# Patient Record
Sex: Female | Born: 1995 | Race: Black or African American | Hispanic: No | Marital: Single | Smoking: Never smoker
Health system: Southern US, Community
[De-identification: ages and names within clinical notes are randomized; demographics above are authoritative.]

## PROBLEM LIST (undated history)

## (undated) DIAGNOSIS — F419 Anxiety disorder, unspecified: Secondary | ICD-10-CM

---
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
## 2017-04-22 ENCOUNTER — Encounter (HOSPITAL_COMMUNITY): Payer: Self-pay | Admitting: Emergency Medicine

## 2017-04-22 DIAGNOSIS — F419 Anxiety disorder, unspecified: Secondary | ICD-10-CM | POA: Insufficient documentation

## 2017-04-22 DIAGNOSIS — Z79899 Other long term (current) drug therapy: Secondary | ICD-10-CM | POA: Insufficient documentation

## 2017-04-22 DIAGNOSIS — R51 Headache: Secondary | ICD-10-CM | POA: Insufficient documentation

## 2017-04-22 NOTE — ED Triage Notes (Signed)
Pt states she has been feeling anxious today like she was going to have panic attacks  Pt states she has hx of same but is not on any medication for it  Pt denies any different stressors at this time  Pt is c/o headache

## 2017-04-23 ENCOUNTER — Emergency Department (HOSPITAL_COMMUNITY)
Admission: EM | Admit: 2017-04-23 | Discharge: 2017-04-23 | Disposition: A | Discharge status: Home / Self Care | Payer: No Typology Code available for payment source | Attending: Emergency Medicine | Admitting: Emergency Medicine

## 2017-04-23 DIAGNOSIS — F419 Anxiety disorder, unspecified: Secondary | ICD-10-CM

## 2017-04-23 DIAGNOSIS — R51 Headache: Secondary | ICD-10-CM

## 2017-04-23 DIAGNOSIS — R519 Headache, unspecified: Secondary | ICD-10-CM

## 2017-04-23 HISTORY — DX: Anxiety disorder, unspecified: F41.9

## 2017-04-23 MED ORDER — KETOROLAC TROMETHAMINE 30 MG/ML IJ SOLN
30.0000 mg | Freq: Once | INTRAMUSCULAR | Status: AC
  Administered 2017-04-23: 30 mg via INTRAMUSCULAR
  Filled 2017-04-23: qty 1

## 2017-04-23 NOTE — ED Provider Notes (Signed)
[facility]-EMERGENCY DEPT Provider Note   CSN: [id number] Arrival date & time: 04/22/17  2325     History   Chief Complaint Chief Complaint  Patient presents with  . Anxiety    HPI Maureen Donovan is a 21 y.o. female.  HPI   21 year old female presents today with complaints of anxiety and headache. Patient notes left-sided throbbing headache onset this morning. She notes symptoms persisted despite ibuprofen therapy. She denies any acute neurological deficits, denies any fever, neck stiffness, or any other red flags for headache. Patient denies any known triggers. Patient also reports she had an anxiety attack earlier in the day, likely secondary to headache. Patient reports this anxiety attack similar to previous that she has a history of the same. Patient denies any other concerning signs or symptoms today.   Past Medical History:  Diagnosis Date  . Anxiety     There are no active problems to display for this patient.   History reviewed. No pertinent surgical history.  OB History    No data available       Home Medications    Prior to Admission medications   Medication Sig Start Date End Date Taking? Authorizing Provider  HYDROcodone-acetaminophen (NORCO) 5-325 MG tablet Take 1 tablet by mouth 3 (three) times daily as needed. 11/14/16   Menshew, Charlesetta IvoryJenise V Bacon, PA-C  naproxen (EC NAPROSYN) 500 MG EC tablet Take 1 tablet (500 mg total) by mouth 2 (two) times daily with a meal. 11/14/16   Menshew, Charlesetta IvoryJenise V Bacon, PA-C    Family History Family History  Problem Relation Age of Onset  . Hypertension Other     Social History Social History  Substance Use Topics  . Smoking status: Never Smoker  . Smokeless tobacco: Never Used  . Alcohol use No     Allergies   Patient has no known allergies.   Review of Systems Review of Systems  All other systems reviewed and are negative.    Physical Exam Updated Vital Signs BP (!) 145/88 (BP Location: Left Arm)   Pulse (!) 115    Temp 99 F (37.2 C) (Oral)   Resp 16   Ht 5\' 11"  (1.803 m)   Wt 117.9 kg (260 lb)   LMP 04/02/2017 (Exact Date)   SpO2 100%   BMI 36.26 kg/m   Physical Exam  Constitutional: She is oriented to person, place, and time. She appears well-developed and well-nourished. No distress.  HENT:  Head: Normocephalic.  Eyes: Pupils are equal, round, and reactive to light. Conjunctivae and EOM are normal. Right eye exhibits no discharge. Left eye exhibits no discharge. No scleral icterus.  Neck: Normal range of motion. Neck supple. No JVD present.  Pulmonary/Chest: No stridor.  Musculoskeletal: Normal range of motion. She exhibits no edema or tenderness.  Lymphadenopathy:    She has no cervical adenopathy.  Neurological: She is alert and oriented to person, place, and time. She has normal strength. She displays no atrophy and no tremor. No cranial nerve deficit or sensory deficit. She exhibits normal muscle tone. She displays a negative Romberg sign. She displays no seizure activity. Coordination and gait normal. GCS eye subscore is 4. GCS verbal subscore is 5. GCS motor subscore is 6.  Reflex Scores:      Patellar reflexes are 2+ on the right side and 2+ on the left side. Skin: She is not diaphoretic.  Nursing note and vitals reviewed.    ED Treatments / Results  Labs (all labs ordered are listed,  but only abnormal results are displayed) Labs Reviewed - No data to display  EKG  EKG Interpretation None       Radiology No results found.  Procedures Procedures (including critical care time)  Medications Ordered in ED Medications  ketorolac (TORADOL) 30 MG/ML injection 30 mg (30 mg Intramuscular Given 04/23/17 0124)     Initial Impression / Assessment and Plan / ED Course  I have reviewed the triage vital signs and the nursing notes.  Pertinent labs & imaging results that were available during my care of the patient were reviewed by me and considered in my medical decision  making (see chart for details).     Final Clinical Impressions(s) / ED Diagnoses   Final diagnoses:  Acute nonintractable headache, unspecified headache type  Anxiety    Labs:   Imaging:  Consults:  Therapeutics: Toradol Discharge Meds:   Assessment/Plan:   21 year old female presents today with complaints of headache and anxiety. Patient has no red flags here today they would necessitate further evaluation or management. Patient was treated with Toradol which seemed to improve her symptoms. Patient will be discharged home with instructions to use Tylenol or ibuprofen at home. She will return to the emergency room if she develops any new or worsening signs or symptoms. Patient also had an anxiety attack similar to previous. No anxiety throughout my evaluation. Patient verbalized understanding and agreement to today's plan had no further questions or concerns at the time discharge.    New Prescriptions New Prescriptions   No medications on file     Rosalio LoudHedges, Tylor Gambrill, PA-C 04/23/17 0215    Ward, Layla MawKristen N, DO 04/23/17 [phone]

## 2017-04-23 NOTE — ED Notes (Signed)
Bed: WA01 Expected date:  Expected time:  Means of arrival:  Comments: 

## 2017-04-23 NOTE — Discharge Instructions (Signed)
Please read attached information. If you experience any new or worsening signs or symptoms please return to the emergency room for evaluation. Please follow-up with your primary care provider or specialist as discussed. Please use Tylenol and/or ibuprofen at home as needed for headache.

## 2017-09-30 ENCOUNTER — Other Ambulatory Visit: Payer: Self-pay

## 2017-09-30 ENCOUNTER — Emergency Department (HOSPITAL_COMMUNITY): Payer: Self-pay

## 2017-09-30 ENCOUNTER — Emergency Department (HOSPITAL_COMMUNITY)
Admission: EM | Admit: 2017-09-30 | Discharge: 2017-09-30 | Disposition: A | Discharge status: Home / Self Care | Payer: Self-pay | Attending: Emergency Medicine | Admitting: Emergency Medicine

## 2017-09-30 DIAGNOSIS — W230XXA Caught, crushed, jammed, or pinched between moving objects, initial encounter: Secondary | ICD-10-CM | POA: Insufficient documentation

## 2017-09-30 DIAGNOSIS — Y999 Unspecified external cause status: Secondary | ICD-10-CM | POA: Insufficient documentation

## 2017-09-30 DIAGNOSIS — Y939 Activity, unspecified: Secondary | ICD-10-CM | POA: Insufficient documentation

## 2017-09-30 DIAGNOSIS — S62664A Nondisplaced fracture of distal phalanx of right ring finger, initial encounter for closed fracture: Secondary | ICD-10-CM | POA: Insufficient documentation

## 2017-09-30 DIAGNOSIS — Z79899 Other long term (current) drug therapy: Secondary | ICD-10-CM | POA: Insufficient documentation

## 2017-09-30 DIAGNOSIS — Y929 Unspecified place or not applicable: Secondary | ICD-10-CM | POA: Insufficient documentation

## 2017-09-30 NOTE — ED Provider Notes (Signed)
[facility]-EMERGENCY DEPT Provider Note   CSN: [id number] Arrival date & time: 09/30/17  1436     History   Chief Complaint Chief Complaint  Patient presents with  . Finger Injury    HPI Maureen Donovan is a 22 y.o. female.  HPI   22 year old female presents today with complaints of finger injury.  Patient reports that prior to arrival she slammed her middle finger right in the car door.  She notes small bleeding at the base of the nail and pain at the DIP.  She reports range of motion is decreased due to the pain.  No other injuries noted.  No medications prior to arrival.  Patient is right-hand dominant and [place] at a [employer] [place] [employer]      Past Medical History:  Diagnosis Date  . Anxiety     There are no active problems to display for this patient.   No past surgical history on file.  OB History    No data available       Home Medications    Prior to Admission medications   Medication Sig Start Date End Date Taking? Authorizing Provider  HYDROcodone-acetaminophen (NORCO) 5-325 MG tablet Take 1 tablet by mouth 3 (three) times daily as needed. 11/14/16   Menshew, Charlesetta IvoryJenise V Bacon, PA-C  naproxen (EC NAPROSYN) 500 MG EC tablet Take 1 tablet (500 mg total) by mouth 2 (two) times daily with a meal. 11/14/16   Menshew, Charlesetta IvoryJenise V Bacon, PA-C    Family History Family History  Problem Relation Age of Onset  . Hypertension Other     Social History Social History   Tobacco Use  . Smoking status: Never Smoker  . Smokeless tobacco: Never Used  Substance Use Topics  . Alcohol use: No  . Drug use: No     Allergies   Patient has no known allergies.   Review of Systems Review of Systems  All other systems reviewed and are negative.    Physical Exam Updated Vital Signs BP 140/86 (BP Location: Right Arm)   Pulse (!) 106   Temp 98.6 F (37 C) (Oral)   Resp 18   Ht 5\' 11"  (1.803 m)   Wt 133.4 kg (294 lb)   LMP 09/11/2017  (Approximate)   SpO2 100%   BMI 41.00 kg/m   Physical Exam  Constitutional: She is oriented to person, place, and time. She appears well-developed and well-nourished.  HENT:  Head: Normocephalic and atraumatic.  Eyes: Conjunctivae are normal. Pupils are equal, round, and reactive to light. Right eye exhibits no discharge. Left eye exhibits no discharge. No scleral icterus.  Neck: Normal range of motion. No JVD present. No tracheal deviation present.  Pulmonary/Chest: Effort normal. No stridor.  Musculoskeletal:  Left middle finger with small amount of blood in the proximal nail fold, no subungual hematoma, no open lacerations minor swelling at the DIP  Neurological: She is alert and oriented to person, place, and time. Coordination normal.  Psychiatric: She has a normal mood and affect. Her behavior is normal. Judgment and thought content normal.  Nursing note and vitals reviewed.    ED Treatments / Results  Labs (all labs ordered are listed, but only abnormal results are displayed) Labs Reviewed - No data to display  EKG  EKG Interpretation None       Radiology Dg Finger Middle Right  Result Date: 09/30/2017 CLINICAL DATA:  Closed finger in door. EXAM: RIGHT MIDDLE FINGER 2+V COMPARISON:  None.  FINDINGS: Small avulsion fracture at the base of thea distal phalanx at the DIP joint. No other fracture or arthropathy. IMPRESSION: Small avulsion fracture at the base of the distal phalanx. Electronically Signed   By: Marlan Palau M.D.   On: 09/30/2017 16:21    Procedures Procedures (including critical care time)  SPLINT APPLICATION Date/Time: 6:36 PM Authorized by: Kelle Darting Seidy Labreck Consent: Verbal consent obtained. Risks and benefits: risks, benefits and alternatives were discussed Consent given by: patient Splint applied by: orthopedic technician Location details: Right middle finger Splint type: Aluminum Supplies used: Aluminum Post-procedure: The splinted body  part was neurovascularly unchanged following the procedure. Patient tolerance: Patient tolerated the procedure well with no immediate complications.     Medications Ordered in ED Medications - No data to display   Initial Impression / Assessment and Plan / ED Course  I have reviewed the triage vital signs and the nursing notes.  Pertinent labs & imaging results that were available during my care of the patient were reviewed by me and considered in my medical decision making (see chart for details).     Patient presents with a very small avulsion fracture at the DIP.  No complicating features.  She will be placed in a aluminum splint which she is encouraged to wear for the next week and a half, she may remove the splint and gentle range of motion thereafter.  Patient will follow-up in the emergency room if any new or worsening signs or symptoms present, and will follow-up as an outpatient with her primary care if she has any questions or concerns.  Patient given strict return precautions, she verbalized understanding and agreement to today's plan.  Rice instructions given.  Final Clinical Impressions(s) / ED Diagnoses   Final diagnoses:  Closed nondisplaced fracture of distal phalanx of right ring finger, initial encounter    ED Discharge Orders    None       Rosalio Loud 09/30/17 1836    Lorre Nick, MD 10/01/17 [phone]

## 2017-09-30 NOTE — ED Triage Notes (Signed)
Pt reports she slammed her middle finger of her right hand in her car door.  Pt is able to move all digits but reports tingling to that finger.  Pt reports that there was bleeding when it first happened but bleeding is currently controlled.

## 2017-09-30 NOTE — Discharge Instructions (Signed)
Please read attached information. If you experience any new or worsening signs or symptoms please return to the emergency room for evaluation. Please follow-up with your primary care provider or specialist as discussed.  °

## 2018-10-09 ENCOUNTER — Emergency Department (HOSPITAL_COMMUNITY)
Admission: EM | Admit: 2018-10-09 | Discharge: 2018-10-09 | Disposition: A | Discharge status: Home / Self Care | Payer: PRIVATE HEALTH INSURANCE | Attending: Emergency Medicine | Admitting: Emergency Medicine

## 2018-10-09 ENCOUNTER — Other Ambulatory Visit: Payer: Self-pay

## 2018-10-09 DIAGNOSIS — J029 Acute pharyngitis, unspecified: Secondary | ICD-10-CM | POA: Diagnosis not present

## 2018-10-09 DIAGNOSIS — R070 Pain in throat: Secondary | ICD-10-CM | POA: Diagnosis present

## 2018-10-09 DIAGNOSIS — J039 Acute tonsillitis, unspecified: Secondary | ICD-10-CM

## 2018-10-09 LAB — GROUP A STREP BY PCR: Group A Strep by PCR: NOT DETECTED

## 2018-10-09 LAB — POC URINE PREG, ED: PREG TEST UR: NEGATIVE

## 2018-10-09 MED ORDER — ACETAMINOPHEN 500 MG PO TABS
1000.0000 mg | ORAL_TABLET | Freq: Once | ORAL | Status: AC
  Administered 2018-10-09: 1000 mg via ORAL
  Filled 2018-10-09: qty 2

## 2018-10-09 MED ORDER — DEXAMETHASONE SODIUM PHOSPHATE 10 MG/ML IJ SOLN
10.0000 mg | Freq: Once | INTRAMUSCULAR | Status: AC
  Administered 2018-10-09: 10 mg via INTRAMUSCULAR
  Filled 2018-10-09: qty 1

## 2018-10-09 NOTE — ED Triage Notes (Signed)
Pt from home c/o sore throat, nasal congestion x1 day.  States that her ears hurt and her nose has been running.  Denies being around anyone sick, reports only taking cough drops at home.

## 2018-10-09 NOTE — Discharge Instructions (Signed)
You can take Tylenol or Ibuprofen as directed for pain. You can alternate Tylenol and Ibuprofen every 4 hours. If you take Tylenol at 1pm, then you can take Ibuprofen at 5pm. Then you can take Tylenol again at 9pm.   Make sure you are drinking plenty of fluids and staying hydrated.  Return to emergency department for any high fever, difficulty swallowing your saliva, difficulty breathing, vomiting, worsening pain or any other worsening or concerning symptoms.

## 2018-10-09 NOTE — ED Provider Notes (Signed)
[facility]-EMERGENCY DEPT Provider Note   CSN: [id number] Arrival date & time: 10/09/18  5573     History   Chief Complaint Chief Complaint  Patient presents with  . Sore Throat    HPI Maureen Donovan is a 23 y.o. female who presents for evaluation of 2 days of sore throat, nasal congestion.  Patient also reports some bilateral ear fullness.  He reports he has not taken a medication for her symptoms.  Patient states that she still able to swallow her secretions and low-grade but does report that it hurts when she swallows.  She states that she does attend college but has not been around anyone sick.  Patient reports she has been taking cough drops which has not really been helping.  She denies any chest pain, nausea, vomiting, diarrhea, abdominal pain.  She reports she has maybe felt some subjective fever chills but has not actually measured a temperature.   The history is provided by the patient.    Past Medical History:  Diagnosis Date  . Anxiety     There are no active problems to display for this patient.   No past surgical history on file.   OB History   No obstetric history on file.      Home Medications    Prior to Admission medications   Medication Sig Start Date End Date Taking? Authorizing Provider  naproxen (EC NAPROSYN) 500 MG EC tablet Take 1 tablet (500 mg total) by mouth 2 (two) times daily with a meal. Patient taking differently: Take 500 mg by mouth 2 (two) times daily as needed (headache).  11/14/16  Yes Menshew, Charlesetta Ivory, PA-C  Throat Lozenges (VICKS COUGH DROPS MT) Use as directed 1 Dose in the mouth or throat every 4 (four) hours as needed (cough).    Yes [provider]  HYDROcodone-acetaminophen (NORCO) 5-325 MG tablet Take 1 tablet by mouth 3 (three) times daily as needed. Patient not taking: Reported on 10/09/2018 11/14/16   Menshew, Charlesetta Ivory, PA-C    Family History Family History  Problem Relation Age of  Onset  . Hypertension Other     Social History Social History   Tobacco Use  . Smoking status: Never Smoker  . Smokeless tobacco: Never Used  Substance Use Topics  . Alcohol use: No  . Drug use: No     Allergies   Patient has no known allergies.   Review of Systems Review of Systems  Constitutional: Negative for fever (subjective).  HENT: Positive for congestion and sore throat. Negative for drooling and trouble swallowing.   Respiratory: Negative for cough and shortness of breath.   Cardiovascular: Negative for chest pain.  Gastrointestinal: Negative for abdominal pain, nausea and vomiting.  All other systems reviewed and are negative.    Physical Exam Updated Vital Signs BP 125/88   Pulse 74   Temp 98.8 F (37.1 C)   Resp 16   SpO2 99%   Physical Exam Vitals signs and nursing note reviewed.  Constitutional:      Appearance: She is well-developed.  HENT:     Head: Normocephalic and atraumatic.     Nose: Congestion present.     Comments: Edematous and erythematous nasal turbinates bilaterally.    Mouth/Throat:     Mouth: Mucous membranes are moist.     Pharynx: Uvula midline. Pharyngeal swelling and posterior oropharyngeal erythema present.     Tonsils: No tonsillar exudate or tonsillar abscesses.     Comments:  Airways patent, phonation is intact.  Posterior oropharynx is erythematous, slightly edematous.  No exudates noted. Eyes:     General: No scleral icterus.       Right eye: No discharge.        Left eye: No discharge.     Conjunctiva/sclera: Conjunctivae normal.  Pulmonary:     Effort: Pulmonary effort is normal.  Skin:    General: Skin is warm and dry.  Neurological:     Mental Status: She is alert.  Psychiatric:        Speech: Speech normal.        Behavior: Behavior normal.      ED Treatments / Results  Labs (all labs ordered are listed, but only abnormal results are displayed) Labs Reviewed  GROUP A STREP BY PCR  POC URINE PREG, ED     EKG None  Radiology No results found.  Procedures Procedures (including critical care time)  Medications Ordered in ED Medications  acetaminophen (TYLENOL) tablet 1,000 mg (1,000 mg Oral Given 10/09/18 0905)  dexamethasone (DECADRON) injection 10 mg (10 mg Intramuscular Given 10/09/18 0906)     Initial Impression / Assessment and Plan / ED Course  I have reviewed the triage vital signs and the nursing notes.  Pertinent labs & imaging results that were available during my care of the patient were reviewed by me and considered in my medical decision making (see chart for details).     23 year old female who presents for evaluation of sore throat, nasal congestion x2 days.  Port subjective fever did not measure temperature.  She still able to tolerate secretions. Patient is afebrile, non-toxic appearing, sitting comfortably on examination table. Vital signs reviewed and stable.  On exam, posterior oropharynx is slightly erythematous, edematous.  No evidence of exudates.  Uvula is midline.  No trismus.  Consider pharyngitis versus tonsillitis.  History/physical exam not concerning for Ludwig angina, peritonsillar abscess.  Will plan for rapid strep here in the ED.  Rapid strep reviewed and negative.  Discussed results with patient.  Encourage at home supportive care measures. At this time, patient exhibits no emergent life-threatening condition that require further evaluation in ED or admission. Patient had ample opportunity for questions and discussion. All patient's questions were answered with full understanding. Strict return precautions discussed. Patient expresses understanding and agreement to plan.   Portions of this note were generated with Scientist, clinical (histocompatibility and immunogenetics)Dragon dictation software. Dictation errors may occur despite best attempts at proofreading.   Final Clinical Impressions(s) / ED Diagnoses   Final diagnoses:  Sore throat  Tonsillitis    ED Discharge Orders    None       Rosana HoesLayden,  Barack Nicodemus A, PA-C 10/09/18 [id number]    Raeford RazorKohut, Stephen, MD 10/10/18 [phone]

## 2019-08-20 ENCOUNTER — Emergency Department (HOSPITAL_COMMUNITY)
Admission: EM | Admit: 2019-08-20 | Discharge: 2019-08-20 | Disposition: A | Discharge status: Home / Self Care | Payer: No Typology Code available for payment source | Attending: Emergency Medicine | Admitting: Emergency Medicine

## 2019-08-20 ENCOUNTER — Other Ambulatory Visit: Payer: Self-pay

## 2019-08-20 ENCOUNTER — Encounter (HOSPITAL_COMMUNITY): Payer: Self-pay | Admitting: Emergency Medicine

## 2019-08-20 ENCOUNTER — Emergency Department (HOSPITAL_COMMUNITY): Payer: No Typology Code available for payment source

## 2019-08-20 DIAGNOSIS — S6992XA Unspecified injury of left wrist, hand and finger(s), initial encounter: Secondary | ICD-10-CM | POA: Insufficient documentation

## 2019-08-20 DIAGNOSIS — S299XXA Unspecified injury of thorax, initial encounter: Secondary | ICD-10-CM | POA: Insufficient documentation

## 2019-08-20 DIAGNOSIS — Y93I9 Activity, other involving external motion: Secondary | ICD-10-CM | POA: Insufficient documentation

## 2019-08-20 DIAGNOSIS — R519 Headache, unspecified: Secondary | ICD-10-CM | POA: Diagnosis not present

## 2019-08-20 DIAGNOSIS — Y998 Other external cause status: Secondary | ICD-10-CM | POA: Insufficient documentation

## 2019-08-20 DIAGNOSIS — S3992XA Unspecified injury of lower back, initial encounter: Secondary | ICD-10-CM | POA: Diagnosis present

## 2019-08-20 DIAGNOSIS — S8991XA Unspecified injury of right lower leg, initial encounter: Secondary | ICD-10-CM | POA: Diagnosis not present

## 2019-08-20 DIAGNOSIS — S8992XA Unspecified injury of left lower leg, initial encounter: Secondary | ICD-10-CM | POA: Diagnosis not present

## 2019-08-20 DIAGNOSIS — Y92410 Unspecified street and highway as the place of occurrence of the external cause: Secondary | ICD-10-CM | POA: Diagnosis not present

## 2019-08-20 DIAGNOSIS — F419 Anxiety disorder, unspecified: Secondary | ICD-10-CM | POA: Diagnosis not present

## 2019-08-20 LAB — POC URINE PREG, ED: Preg Test, Ur: NEGATIVE

## 2019-08-20 MED ORDER — METHOCARBAMOL 500 MG PO TABS: 500.0000 mg | ORAL_TABLET | Freq: Two times a day (BID) | ORAL | 0 refills | Status: AC | PRN

## 2019-08-20 MED ORDER — ACETAMINOPHEN 325 MG PO TABS
650.0000 mg | ORAL_TABLET | Freq: Once | ORAL | Status: AC
Start: 2019-08-20 — End: 2019-08-20
  Administered 2019-08-20: 650 mg via ORAL
  Filled 2019-08-20: qty 2
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
## 2020-04-08 ENCOUNTER — Encounter (HOSPITAL_COMMUNITY): Payer: Self-pay

## 2020-04-08 ENCOUNTER — Emergency Department (HOSPITAL_COMMUNITY)
Admission: EM | Admit: 2020-04-08 | Discharge: 2020-04-08 | Disposition: A | Discharge status: Home / Self Care | Payer: 59 | Attending: Emergency Medicine | Admitting: Emergency Medicine

## 2020-04-08 ENCOUNTER — Other Ambulatory Visit: Payer: Self-pay

## 2020-04-08 DIAGNOSIS — H9202 Otalgia, left ear: Secondary | ICD-10-CM | POA: Diagnosis present

## 2020-04-08 NOTE — Discharge Instructions (Signed)
You may use over-the-counter ear drops for swimmer's ear (usually a mix of vinegar and alcohol) twice a day for 1 week. Try taking sudafed on a schedule for 2-3 days. You may take tylenol and motrin as needed for headache. Follow up with ENT clinic if symptoms do not improve.

## 2020-04-08 NOTE — ED Notes (Signed)
Pt verbalizes understanding of DC instructions. Pt belongings returned and is ambulatory out of ED.  

## 2020-04-08 NOTE — ED Triage Notes (Signed)
Pt arrived ambulatory into ED CC left ear pain and bilateral ear drainage X 1 week. Pt denies recent water exposure/submersion or fever. Pt reports drainage is "white and chunky" .    Hx none significant

## 2020-04-08 NOTE — ED Provider Notes (Signed)
[facility]-EMERGENCY DEPT Provider Note   CSN: [id number] Arrival date & time: 04/08/20  1125     History Chief Complaint  Patient presents with  . Otalgia    Left     Maureen Donovan is a 24 y.o. female.  24 year old female who presents with ear drainage.  Patient states for the past 1 week, she has been waking up in the morning with fluid coming out of her left ear.  She told triage bilateral ear drainage but notes to me that it has just been the left ear.  She denies any associated ear pain or significant pressure.  She does not notice any drainage during the day.  She denies any swimming or water exposure.  She denies any URI symptoms including no cough, nasal congestion, or sinus pressure.  No dental pain.  No history of diabetes.  The history is provided by the patient.  Otalgia Associated symptoms: ear discharge and headaches   Associated symptoms: no cough, no fever, no rhinorrhea and no sore throat        Past Medical History:  Diagnosis Date  . Anxiety     There are no problems to display for this patient.   History reviewed. No pertinent surgical history.   OB History   No obstetric history on file.     Family History  Problem Relation Age of Onset  . Hypertension Other     Social History   Tobacco Use  . Smoking status: Never Smoker  . Smokeless tobacco: Never Used  Vaping Use  . Vaping Use: Never used  Substance Use Topics  . Alcohol use: No  . Drug use: No    Home Medications Prior to Admission medications   Medication Sig Start Date End Date Taking? Authorizing Provider  HYDROcodone-acetaminophen (NORCO) 5-325 MG tablet Take 1 tablet by mouth 3 (three) times daily as needed. Patient not taking: Reported on 10/09/2018 11/14/16   Menshew, Charlesetta Ivory, PA-C  methocarbamol (ROBAXIN) 500 MG tablet Take 1 tablet (500 mg total) by mouth 2 (two) times daily as needed for muscle spasms. 08/20/19   Albrizze, Kaitlyn E, PA-C    naproxen (EC NAPROSYN) 500 MG EC tablet Take 1 tablet (500 mg total) by mouth 2 (two) times daily with a meal. Patient taking differently: Take 500 mg by mouth 2 (two) times daily as needed (headache).  11/14/16   Menshew, Charlesetta Ivory, PA-C  Throat Lozenges (VICKS COUGH DROPS MT) Use as directed 1 Dose in the mouth or throat every 4 (four) hours as needed (cough).     [provider]    Allergies    Patient has no known allergies.  Review of Systems   Review of Systems  Constitutional: Negative for fever.  HENT: Positive for ear discharge. Negative for ear pain, rhinorrhea, sinus pressure, sinus pain and sore throat.   Respiratory: Negative for cough.   Neurological: Positive for headaches.    Physical Exam Updated Vital Signs BP 125/82   Pulse 89   Resp 16   Ht 5\' 11"  (1.803 m)   Wt (!) 140.6 kg   LMP 03/16/2020   SpO2 99%   BMI 43.24 kg/m   Physical Exam Vitals and nursing note reviewed.  Constitutional:      General: She is not in acute distress.    Appearance: She is well-developed.  HENT:     Head: Normocephalic and atraumatic.     Right Ear: Tympanic membrane, ear canal  and external ear normal.     Left Ear: Tympanic membrane, ear canal and external ear normal.     Ears:     Comments: No cerumen, canal debris, drainage, or erythema of left ear; TM normal with no evidence of perforation Eyes:     Conjunctiva/sclera: Conjunctivae normal.  Musculoskeletal:     Cervical back: Neck supple.  Skin:    General: Skin is warm and dry.  Neurological:     Mental Status: She is alert and oriented to person, place, and time.  Psychiatric:        Judgment: Judgment normal.     ED Results / Procedures / Treatments   Labs (all labs ordered are listed, but only abnormal results are displayed) Labs Reviewed - No data to display  EKG None  Radiology No results found.  Procedures Procedures (including critical care time)  Medications Ordered in  ED Medications - No data to display  ED Course  I have reviewed the triage vital signs and the nursing notes.    MDM Rules/Calculators/A&P                          Ear exam was normal, no drainage, canal debris, or evidence of otitis media. Description was initially suggestive of swimmer's ear although no risk factors for it; exam is not c/w swimmer's ear. I see no evidence of TM perforation. I did recommend OTC swimmer's ear drops (vinegar and alcohol) for several days and sudafed if needed if any congestion. Provided w/ ENT f/u information if symptoms persist. Return precautions reviewed. Final Clinical Impression(s) / ED Diagnoses Final diagnoses:  Acute otalgia, left    Rx / DC Orders ED Discharge Orders    None       Thaddeus Evitts, Ambrose Finland, MD 04/08/20 1441

## 2020-04-28 ENCOUNTER — Emergency Department
Admission: EM | Admit: 2020-04-28 | Discharge: 2020-04-28 | Disposition: A | Discharge status: Home / Self Care | Payer: 59 | Attending: Student in an Organized Health Care Education/Training Program | Admitting: Student in an Organized Health Care Education/Training Program

## 2020-04-28 ENCOUNTER — Other Ambulatory Visit: Payer: Self-pay

## 2020-04-28 ENCOUNTER — Emergency Department: Payer: 59

## 2020-04-28 ENCOUNTER — Encounter: Payer: Self-pay | Admitting: Emergency Medicine

## 2020-04-28 DIAGNOSIS — S93401A Sprain of unspecified ligament of right ankle, initial encounter: Secondary | ICD-10-CM | POA: Diagnosis not present

## 2020-04-28 DIAGNOSIS — M25571 Pain in right ankle and joints of right foot: Secondary | ICD-10-CM | POA: Diagnosis present

## 2020-04-28 DIAGNOSIS — W172XXA Fall into hole, initial encounter: Secondary | ICD-10-CM | POA: Diagnosis not present

## 2020-04-28 DIAGNOSIS — Y9289 Other specified places as the place of occurrence of the external cause: Secondary | ICD-10-CM | POA: Diagnosis not present

## 2020-04-28 DIAGNOSIS — Y939 Activity, unspecified: Secondary | ICD-10-CM | POA: Diagnosis not present

## 2020-04-28 DIAGNOSIS — Y999 Unspecified external cause status: Secondary | ICD-10-CM | POA: Diagnosis not present
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
## 2020-04-28 NOTE — ED Provider Notes (Signed)
[facility] Emergency Department Provider Note  ____________________________________________   First MD Initiated Contact with Patient 04/28/20 1334     (approximate)  I have reviewed the triage vital signs and the nursing notes.   HISTORY  Chief Complaint Ankle Pain    HPI Maureen Donovan is a 24 y.o. female presents emergency department complaining of right ankle pain after stepping in a pothole last night.  Patient states twisted over she had difficulty walking and had pain all night.  States it slightly swollen earlier today.  No other injuries reported at this time.  Pain is 10/10    Past Medical History:  Diagnosis Date  . Anxiety     There are no problems to display for this patient.   History reviewed. No pertinent surgical history.  Prior to Admission medications   Medication Sig Start Date End Date Taking? Authorizing Provider  methocarbamol (ROBAXIN) 500 MG tablet Take 1 tablet (500 mg total) by mouth 2 (two) times daily as needed for muscle spasms. 08/20/19   Albrizze, Kaitlyn E, PA-C  naproxen (EC NAPROSYN) 500 MG EC tablet Take 1 tablet (500 mg total) by mouth 2 (two) times daily with a meal. Patient taking differently: Take 500 mg by mouth 2 (two) times daily as needed (headache).  11/14/16   Menshew, Charlesetta Ivory, PA-C  Throat Lozenges (VICKS COUGH DROPS MT) Use as directed 1 Dose in the mouth or throat every 4 (four) hours as needed (cough).     [provider]    Allergies Patient has no known allergies.  Family History  Problem Relation Age of Onset  . Hypertension Other     Social History Social History   Tobacco Use  . Smoking status: Never Smoker  . Smokeless tobacco: Never Used  Vaping Use  . Vaping Use: Never used  Substance Use Topics  . Alcohol use: No  . Drug use: No    Review of Systems  Constitutional: No fever/chills Eyes: No visual changes. ENT: No sore throat. Respiratory: Denies  cough Genitourinary: Negative for dysuria. Musculoskeletal: Negative for back pain.  Positive right ankle pain Skin: Negative for rash. Psychiatric: no mood changes,     ____________________________________________   PHYSICAL EXAM:  VITAL SIGNS: ED Triage Vitals  Enc Vitals Group     BP 04/28/20 1250 120/90     Pulse Rate 04/28/20 1250 90     Resp 04/28/20 1250 18     Temp 04/28/20 1250 98.5 F (36.9 C)     Temp Source 04/28/20 1250 Oral     SpO2 --      Weight 04/28/20 1251 (!) 317 lb (143.8 kg)     Height 04/28/20 1251 5\' 11"  (1.803 m)     Head Circumference --      Peak Flow --      Pain Score 04/28/20 1251 10     Pain Loc --      Pain Edu? --      Excl. in GC? --     Constitutional: Alert and oriented. Well appearing and in no acute distress. Eyes: Conjunctivae are normal.  Head: Atraumatic. Nose: No congestion/rhinnorhea. Mouth/Throat: Mucous membranes are moist.   Neck:  supple no lymphadenopathy noted Cardiovascular: Normal rate, regular rhythm.  Respiratory: Normal respiratory effort.  No retractions,  GU: deferred Musculoskeletal: FROM all extremities, warm and well perfused, right ankle is tender at the distal fibula and proximal fifth metatarsal, neurovascular is intact Neurologic:  Normal speech  and language.  Skin:  Skin is warm, dry and intact. No rash noted. Psychiatric: Mood and affect are normal. Speech and behavior are normal.  ____________________________________________   LABS (all labs ordered are listed, but only abnormal results are displayed)  Labs Reviewed - No data to display ____________________________________________   ____________________________________________  RADIOLOGY  X-ray of the right ankle  ____________________________________________   PROCEDURES  Procedure(s) performed: Ankle stirrup splint applied and crutches given by the tech   Procedures    ____________________________________________   INITIAL  IMPRESSION / ASSESSMENT AND PLAN / ED COURSE  Pertinent labs & imaging results that were available during my care of the patient were reviewed by me and considered in my medical decision making (see chart for details).   The patient is a 24 year old female presents emergency department with right ankle pain after an injury last night.  Right ankle is tender along the lateral aspect and fifth metatarsal.  X-ray of the right ankle  X-ray of the right ankle is negative.  Explained findings to the patient.  She is to elevate and ice, take ibuprofen for pain as needed.  Use crutches until she is able to bear weight without difficulty.  She was placed in a stirrup splint for extra support.  Follow-up with orthopedics if she is not better in 1 week and to be rex-rayed.  She was discharged stable condition.    Maureen Donovan was evaluated in Emergency Department on 04/28/2020 for the symptoms described in the history of present illness. She was evaluated in the context of the global COVID-19 pandemic, which necessitated consideration that the patient might be at risk for infection with the SARS-CoV-2 virus that causes COVID-19. Institutional protocols and algorithms that pertain to the evaluation of patients at risk for COVID-19 are in a state of rapid change based on information released by regulatory bodies including the CDC and federal and state organizations. These policies and algorithms were followed during the patient's care in the ED.    As part of my medical decision making, I reviewed the following data within the electronic MEDICAL RECORD NUMBER Nursing notes reviewed and incorporated, Old chart reviewed, Radiograph reviewed , Notes from prior ED visits and [place] Controlled Substance Database  ____________________________________________   FINAL CLINICAL IMPRESSION(S) / ED DIAGNOSES  Final diagnoses:  Sprain of right ankle, unspecified ligament, initial encounter      NEW MEDICATIONS STARTED  DURING THIS VISIT:  Discharge Medication List as of 04/28/2020  2:29 PM       Note:  This document was prepared using Dragon voice recognition software and may include unintentional dictation errors.    Faythe Ghee, PA-C 04/28/20 1554    Willy Eddy, MD 04/29/20 0700

## 2020-04-28 NOTE — ED Notes (Signed)
See triage note Presents with pain to right ankle   States she stepped in pot hole yesterday  Good pulses

## 2021-01-30 IMAGING — DX DG ANKLE COMPLETE 3+V*R*
3 series · 3 of 3 positions shown · non-contrast
Comparison: None.

CLINICAL DATA: Right ankle pain following twisting injury
yesterday, initial encounter

EXAM:
RIGHT ANKLE - COMPLETE 3+ VIEW

[ankle ap]
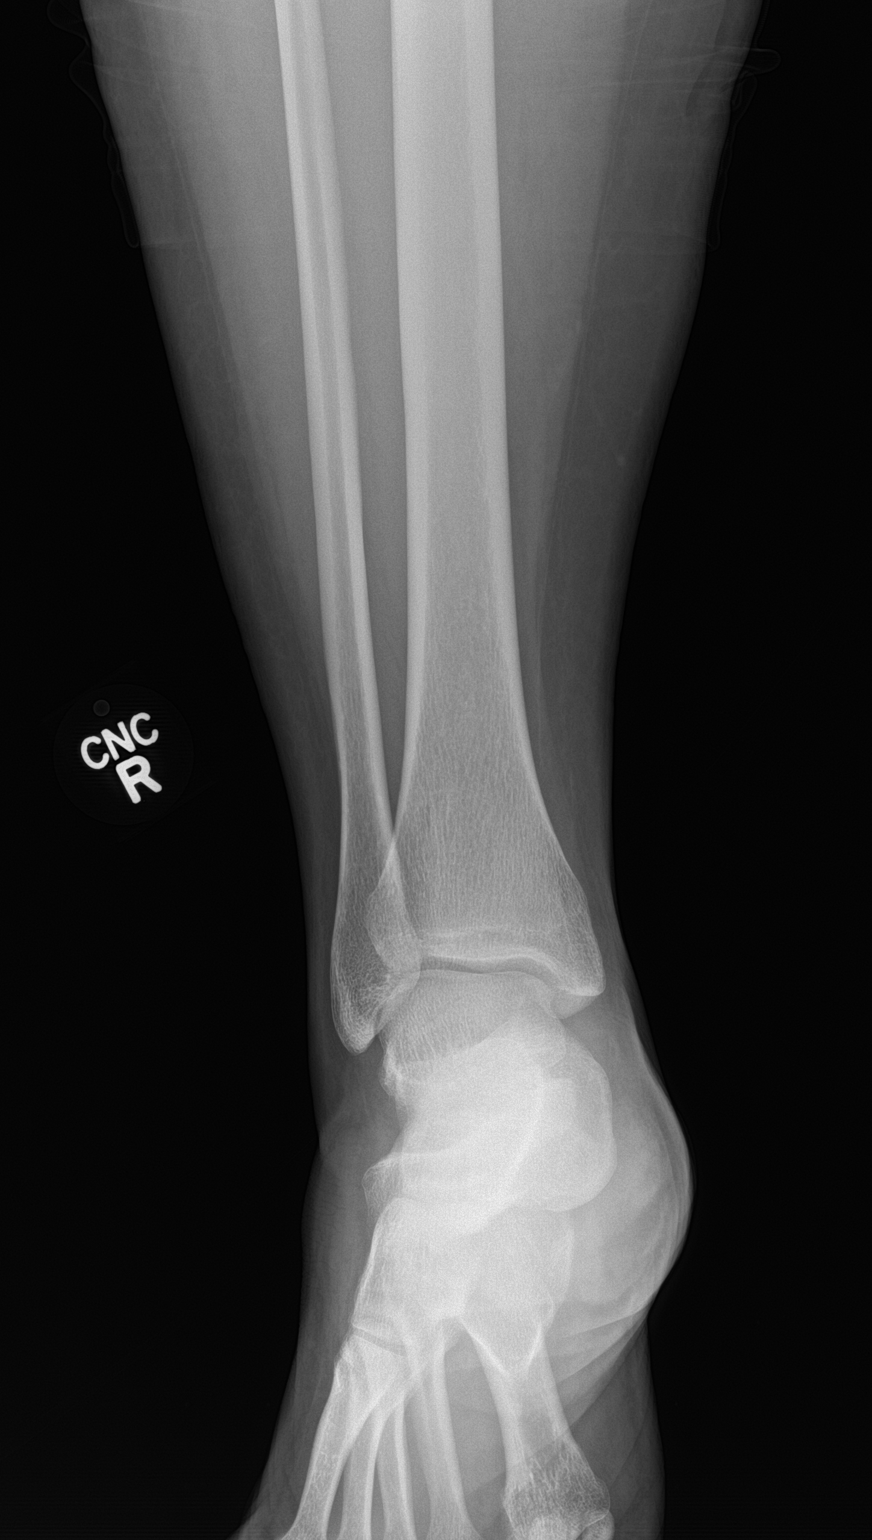

[ankle obl]
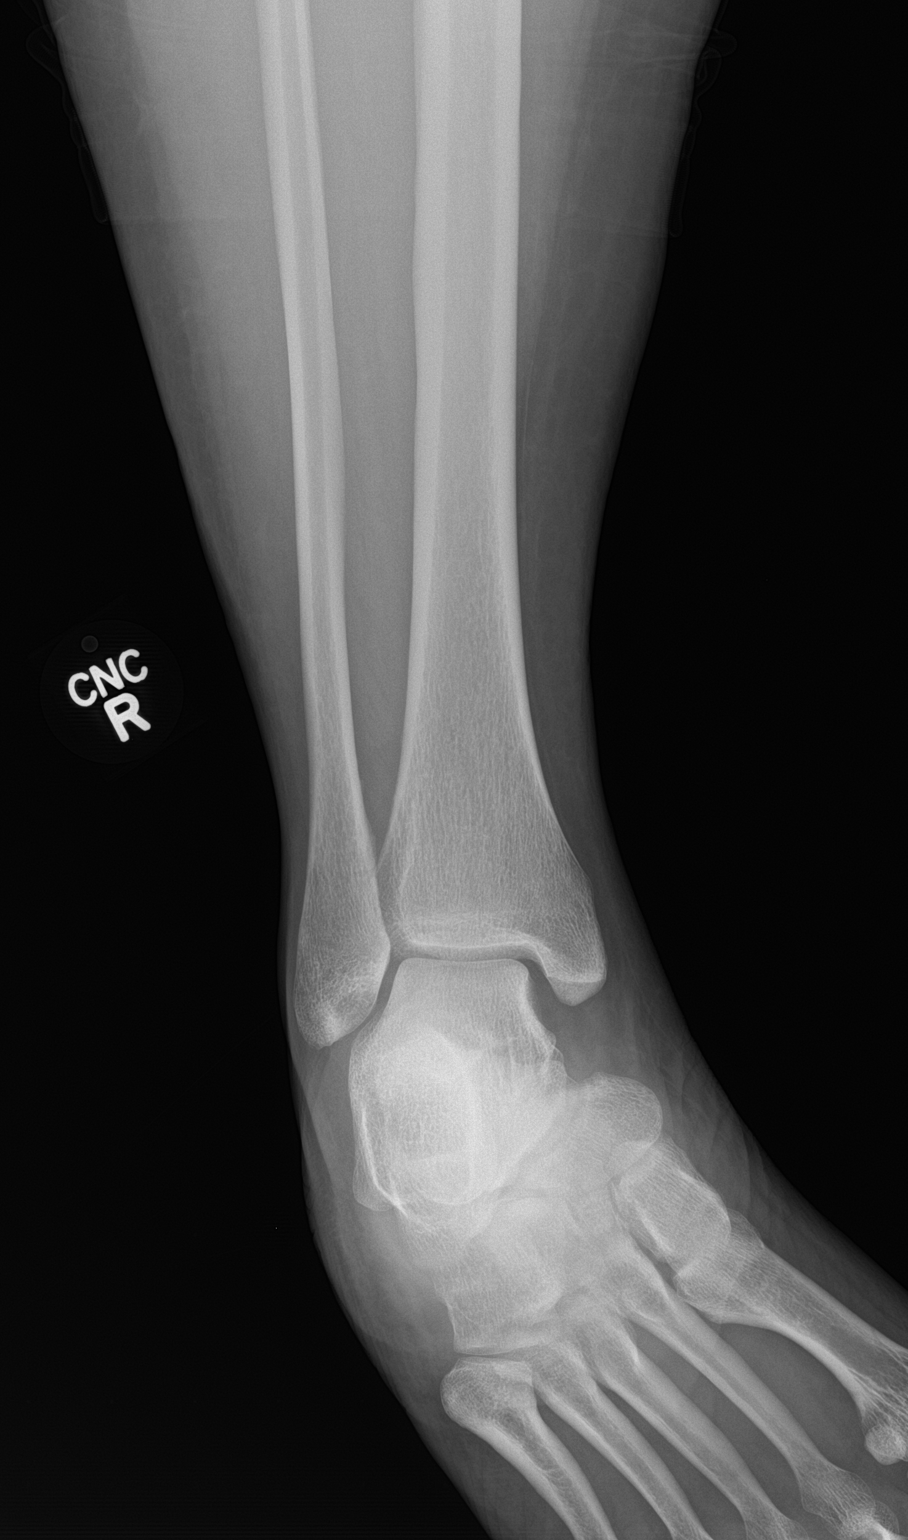

[ankle lat]
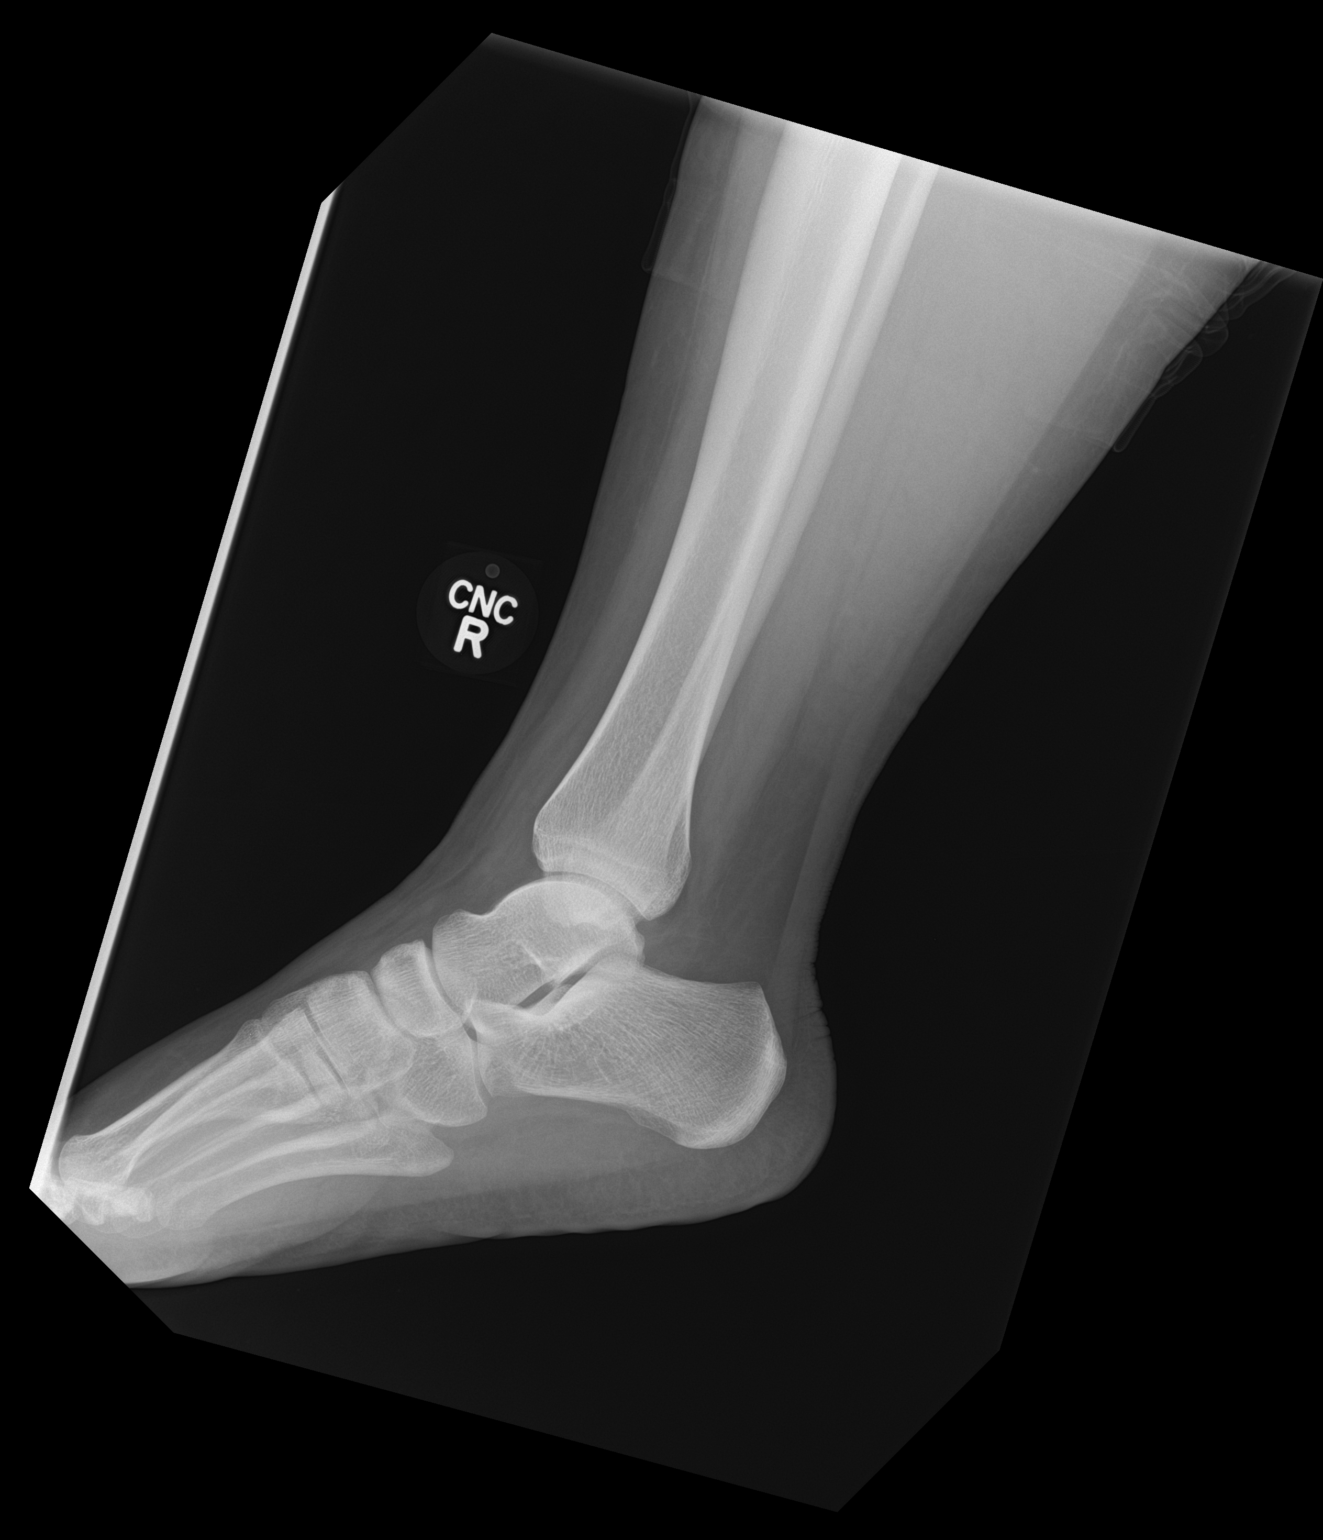

[3 of 3 positions shown; findings below may reference images not displayed]

FINDINGS: There is no evidence of fracture, dislocation, or joint effusion.
There is no evidence of arthropathy or other focal bone abnormality.
Soft tissues are unremarkable.
IMPRESSION: No acute abnormality
